# Patient Record
Sex: Female | Born: 2000
Health system: Southern US, Community
[De-identification: ages and names within clinical notes are randomized; demographics above are authoritative.]

## PROBLEM LIST (undated history)

## (undated) HISTORY — PX: TONSILLECTOMY AND ADENOIDECTOMY: SUR1326

## (undated) HISTORY — PX: TYMPANOPLASTY: SHX33

---
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
## 2021-05-04 ENCOUNTER — Other Ambulatory Visit: Payer: Self-pay

## 2021-05-04 ENCOUNTER — Emergency Department (HOSPITAL_COMMUNITY): Payer: BLUE CROSS/BLUE SHIELD

## 2021-05-04 ENCOUNTER — Encounter (HOSPITAL_COMMUNITY): Payer: Self-pay | Admitting: Emergency Medicine

## 2021-05-04 ENCOUNTER — Emergency Department (HOSPITAL_COMMUNITY)
Admission: EM | Admit: 2021-05-04 | Discharge: 2021-05-04 | Disposition: A | Discharge status: Home / Self Care | Payer: BLUE CROSS/BLUE SHIELD | Attending: Emergency Medicine | Admitting: Emergency Medicine

## 2021-05-04 DIAGNOSIS — N200 Calculus of kidney: Secondary | ICD-10-CM

## 2021-05-04 DIAGNOSIS — N132 Hydronephrosis with renal and ureteral calculous obstruction: Secondary | ICD-10-CM | POA: Insufficient documentation

## 2021-05-04 DIAGNOSIS — F17290 Nicotine dependence, other tobacco product, uncomplicated: Secondary | ICD-10-CM | POA: Insufficient documentation

## 2021-05-04 LAB — URINALYSIS, ROUTINE W REFLEX MICROSCOPIC
Bacteria, UA: NONE SEEN
Bilirubin Urine: NEGATIVE
Glucose, UA: NEGATIVE mg/dL
Ketones, ur: 40 mg/dL — AB
Leukocytes,Ua: NEGATIVE
Nitrite: NEGATIVE
Protein, ur: NEGATIVE mg/dL
Specific Gravity, Urine: 1.015 (ref 1.005–1.030)
pH: 8.5 — ABNORMAL HIGH (ref 5.0–8.0)

## 2021-05-04 LAB — POC URINE PREG, ED: Preg Test, Ur: NEGATIVE

## 2021-05-04 MED ORDER — TAMSULOSIN HCL 0.4 MG PO CAPS: 0.4000 mg | ORAL_CAPSULE | Freq: Every day | ORAL | 0 refills | Status: AC

## 2021-05-04 MED ORDER — ONDANSETRON 4 MG PO TBDP
4.0000 mg | ORAL_TABLET | Freq: Once | ORAL | Status: AC | PRN
  Administered 2021-05-04: 4 mg via ORAL
  Filled 2021-05-04: qty 1

## 2021-05-04 MED ORDER — ONDANSETRON HCL 4 MG/2ML IJ SOLN
4.0000 mg | Freq: Once | INTRAMUSCULAR | Status: AC
  Administered 2021-05-04: 4 mg via INTRAVENOUS
  Filled 2021-05-04: qty 2

## 2021-05-04 MED ORDER — KETOROLAC TROMETHAMINE 30 MG/ML IJ SOLN
30.0000 mg | Freq: Once | INTRAMUSCULAR | Status: AC
  Administered 2021-05-04: 30 mg via INTRAVENOUS
  Filled 2021-05-04: qty 1

## 2021-05-04 MED ORDER — MORPHINE SULFATE (PF) 4 MG/ML IV SOLN: 4.0000 mg | Freq: Once | INTRAVENOUS | Status: DC

## 2021-05-04 MED ORDER — SODIUM CHLORIDE 0.9 % IV BOLUS
1000.0000 mL | Freq: Once | INTRAVENOUS | Status: AC
  Administered 2021-05-04: 1000 mL via INTRAVENOUS

## 2021-05-04 MED ORDER — KETOROLAC TROMETHAMINE 30 MG/ML IJ SOLN
30.0000 mg | Freq: Once | INTRAMUSCULAR | Status: AC
  Administered 2021-05-04: 30 mg via INTRAVENOUS

## 2021-05-04 MED ORDER — KETOROLAC TROMETHAMINE 60 MG/2ML IM SOLN
30.0000 mg | Freq: Once | INTRAMUSCULAR | Status: DC
  Filled 2021-05-04: qty 2

## 2021-05-04 MED ORDER — OXYCODONE-ACETAMINOPHEN 5-325 MG PO TABS: 1.0000 | ORAL_TABLET | Freq: Four times a day (QID) | ORAL | 0 refills | Status: AC | PRN

## 2021-05-04 NOTE — ED Provider Notes (Signed)
[facility]-EMERGENCY DEPT Provider Note   CSN: [id number] Arrival date & time: 05/04/21  1324     History Chief Complaint  Patient presents with   Abdominal Pain   Emesis    Tina Pratt is a 20 y.o. female past medical history of renal calculus at age 8 and removal her right ovary presenting today with a complaint of abdominal pain and vomiting.  She reports that 2 days ago she began to feel pain in her right lower quadrant and back.  The symptoms worsened and she presented to [facility] health in [place] yesterday however she was unsatisfied with her work-up.  Patient sexually active with 1 partner and denies concern for STD or pregnancy today.  Denies urinary symptoms.  Endorses nausea but denies diarrhea or constipation.  No fever, chills, dizziness or fatigue.   Abdominal Pain Associated symptoms: vomiting   Emesis Associated symptoms: abdominal pain       History reviewed. No pertinent past medical history.  Patient Active Problem List   Diagnosis Date Noted   Strain of calf muscle 01/16/2015    Past Surgical History:  Procedure Laterality Date   TONSILLECTOMY AND ADENOIDECTOMY     TYMPANOPLASTY       OB History   No obstetric history on file.     No family history on file.  Social History   Tobacco Use   Smoking status: Every Day    Types: E-cigarettes   Smokeless tobacco: Never  Substance Use Topics   Alcohol use: No    Alcohol/week: 0.0 standard drinks   Drug use: No    Home Medications Prior to Admission medications   Medication Sig Start Date End Date Taking? Authorizing Provider  predniSONE (DELTASONE) 20 MG tablet Take 1 tablet (20 mg total) by mouth 2 (two) times daily with a meal. 11/02/20   Eustace Moore, MD    Allergies    Patient has no known allergies.  Review of Systems   Review of Systems  Gastrointestinal:  Positive for abdominal pain and vomiting.   Physical Exam Updated Vital Signs BP 128/82    Pulse 68   Temp 98.4 F (36.9 C) (Oral)   Resp 16   Ht 5\' 1"  (1.549 m)   Wt 72.6 kg   SpO2 99%   BMI 30.23 kg/m   Physical Exam Vitals and nursing note reviewed.  Constitutional:      Appearance: Normal appearance.  HENT:     Head: Normocephalic and atraumatic.  Eyes:     General: No scleral icterus.    Conjunctiva/sclera: Conjunctivae normal.  Cardiovascular:     Rate and Rhythm: Normal rate and regular rhythm.  Pulmonary:     Effort: Pulmonary effort is normal. No respiratory distress.     Breath sounds: Normal breath sounds.  Abdominal:     General: Abdomen is flat.     Tenderness: There is abdominal tenderness. There is right CVA tenderness. There is no left CVA tenderness.  Skin:    General: Skin is warm and dry.     Coloration: Skin is not pale.     Findings: No rash.  Neurological:     Mental Status: She is alert.  Psychiatric:        Mood and Affect: Mood normal.    ED Results / Procedures / Treatments   Labs (all labs ordered are listed, but only abnormal results are displayed) Labs Reviewed  URINALYSIS, ROUTINE W REFLEX MICROSCOPIC - Abnormal; Notable for the  following components:      Result Value   Color, Urine YELLOW (*)    APPearance CLEAR (*)    pH 8.5 (*)    Hgb urine dipstick LARGE (*)    Ketones, ur 40 (*)    All other components within normal limits  POC URINE PREG, ED    EKG None  Radiology CT Renal Stone Study  Result Date: 05/04/2021 CLINICAL DATA:  Right flank pain. EXAM: CT ABDOMEN AND PELVIS WITHOUT CONTRAST TECHNIQUE: Multidetector CT imaging of the abdomen and pelvis was performed following the standard protocol without IV contrast. COMPARISON:  None. FINDINGS: Lower chest: No acute abnormality. Hepatobiliary: No focal liver abnormality is seen. No gallstones, gallbladder wall thickening, or biliary dilatation. Pancreas: Unremarkable. No pancreatic ductal dilatation or surrounding inflammatory changes. Spleen: Normal in size  without focal abnormality. Adrenals/Urinary Tract: There is right hydronephrosis with right perinephric stranding due to obstruction by 3 mm stone in the mid right U. the left kidney is normal without hydronephrosis. The bilateral adrenal glands are normal. The bladder is normal. Stomach/Bowel: Stomach is within normal limits. The appendix is not definitely seen but no inflammation is noted around cecum. No evidence of bowel wall thickening, distention, or inflammatory changes. Vascular/Lymphatic: No significant vascular findings are present. No enlarged abdominal or pelvic lymph nodes. Reproductive: Uterus and bilateral adnexa are unremarkable. Other: None. Musculoskeletal: No acute abnormality. IMPRESSION: Right hydronephrosis with right perinephric stranding due to obstruction by 3 mm stone in the mid right ureter. Electronically Signed   By: Sherian Rein M.D.   On: 05/04/2021 09:52    Procedures Procedures   Medications Ordered in ED Medications  ketorolac (TORADOL) injection 30 mg (has no administration in time range)  ondansetron (ZOFRAN-ODT) disintegrating tablet 4 mg (4 mg Oral Given 05/04/21 0620)  sodium chloride 0.9 % bolus 1,000 mL (1,000 mLs Intravenous Bolus 05/04/21 0858)  ondansetron (ZOFRAN) injection 4 mg (4 mg Intravenous Given 05/04/21 0859)  ketorolac (TORADOL) 30 MG/ML injection 30 mg (30 mg Intravenous Given 05/04/21 0859)    ED Course  I have reviewed the triage vital signs and the nursing notes.  Pertinent labs & imaging results that were available during my care of the patient were reviewed by me and considered in my medical decision making (see chart for details).    MDM Rules/Calculators/A&P  Tina Pratt is a 20 y.o. female past medical history of renal calculus at age 50 and removal her right ovary presenting today with a complaint of abdominal pain and vomiting.  She reports that 2 days ago she began to feel pain in her right lower quadrant and back.  The symptoms  worsened and she presented to [facility] health in [place] yesterday however she was unsatisfied with her work-up.   Patient found to have 3 mm obstructing stone in the mid right ureter.  Associated hydronephrosis with perinephric stranding.  I contacted the on-call urologist about patient treatment and follow-up.  Dr. Alvester Morin stated that a stone of this size should pass on its own.  Because the patient is without systemic signs of infection, nor displays signs of a urinary infection he suggested that I send the patient home with Flomax and Percocet to assist with the pain and hopefully the passage of the stone.  She will follow-up with his office in 1 week if her pain persists.   Patient educated on Percocet use as well as return precautions.  Patient and fianc agreeable to this plan and stable for discharge.  They are both thankful for her care today.  Final Clinical Impression(s) / ED Diagnoses Final diagnoses:  Renal calculus, right    Rx / DC Orders Results and diagnoses were explained to the patient. Return precautions discussed in full. Patient had no additional questions and expressed complete understanding.     Saddie Benders, PA-C 05/04/21 1039    Ernie Avena, MD 05/04/21 1438

## 2021-05-04 NOTE — Discharge Instructions (Addendum)
You are found to have a kidney stone in your right ureter.  I am sending you some medication that should help you pass the stone.  You also have 3 days worth of Percocet to help with pain and hopefully help with passing the stone.  Please do not drive on this medication.  Know that it may cause some constipation.  You do not have to take the Percocet, only take it for severe pain.  This is a controlled substance so please keep it out of reach of other people.  I am glad you came in today and I hope that you feel better!

## 2021-05-04 NOTE — ED Triage Notes (Signed)
Patient arrives with continued abdominal pain and emesis. Patient evaluated and treated at [facility], but states she has had continued vomiting since leaving.

## 2021-05-08 ENCOUNTER — Ambulatory Visit: Payer: Self-pay

## 2021-05-08 NOTE — Telephone Encounter (Addendum)
Pt. Called back. Vomited yesterday. States the color was black-brown. Still has some nausea.Only happened 1 time.Will continue to monitor. Instructed to return to ED if vomiting continues.Instructed to continue to stay hydrated.   Pt. Seen in ED 05/04/21 with right kidney stone. States pain had gotten better, but last night came back and is severe. Pt. Tearful. Has not called urology because she currently does not have insurance. Will have fiance take her to ED.    Reason for Disposition  [1] SEVERE pain (e.g., excruciating, scale 8-10) AND [2] not improved after pain medicine  Answer Assessment - Initial Assessment Questions 1. LOCATION: "Where does it hurt?" (e.g., left, right)     Right flank and abd. 2. ONSET: "When did the pain start?"     Last week 3. SEVERITY: "How bad is the pain?" (e.g., Scale 1-10; mild, moderate, or severe)   - MILD (1-3): doesn't interfere with normal activities    - MODERATE (4-7): interferes with normal activities or awakens from sleep    - SEVERE (8-10): excruciating pain and patient unable to do normal activities (stays in bed)       Severe 4. PATTERN: "Does the pain come and go, or is it constant?"      Constant 5. CAUSE: "What do you think is causing the pain?"     Kidney stone 6. OTHER SYMPTOMS:  "Do you have any other symptoms?" (e.g., fever, abdominal pain, vomiting, leg weakness, burning with urination, blood in urine)     No 7. PREGNANCY:  "Is there any chance you are pregnant?" "When was your last menstrual period?"     No  Protocols used: Flank Pain-A-AH

## 2022-09-30 IMAGING — CT CT RENAL STONE PROTOCOL
2 of 4 series · 16 of 46 positions shown, 18 images · non-contrast
Comparison: None.

CLINICAL DATA: Right flank pain.

EXAM:
CT ABDOMEN AND PELVIS WITHOUT CONTRAST
TECHNIQUE: Multidetector CT imaging of the abdomen and pelvis was performed
following the standard protocol without IV contrast.

[Series 2: axial st · axial · 0.75mm/px · z∈[+1113,+1533]mm · 13 of 96 slices shown, 15 images]
[im 6/96  soft-tissue]
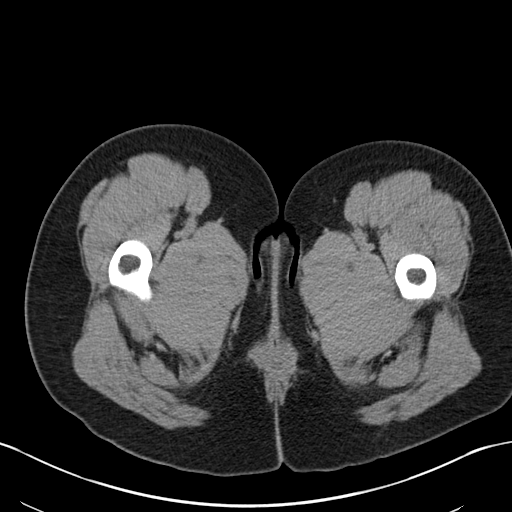
[im 6/96  bone]
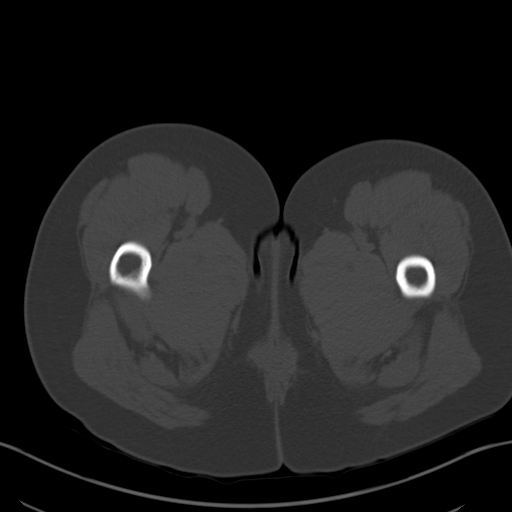
[im 12/96  soft-tissue]
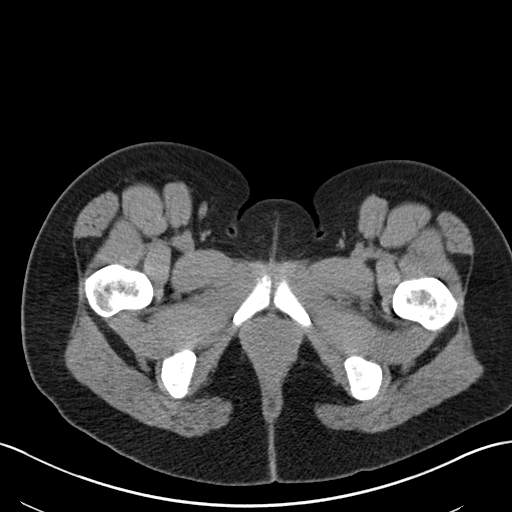
[im 18/96  soft-tissue]
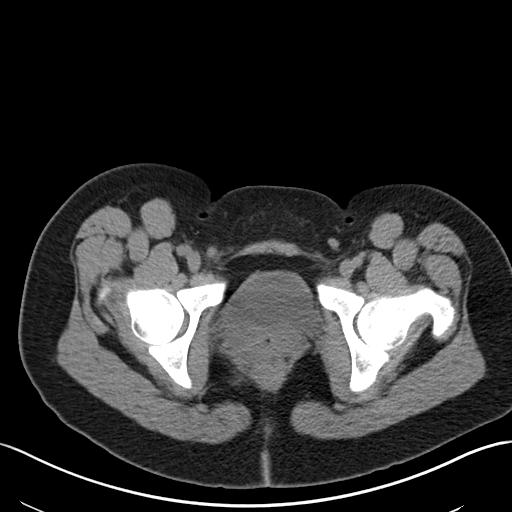
[im 30/96  soft-tissue]
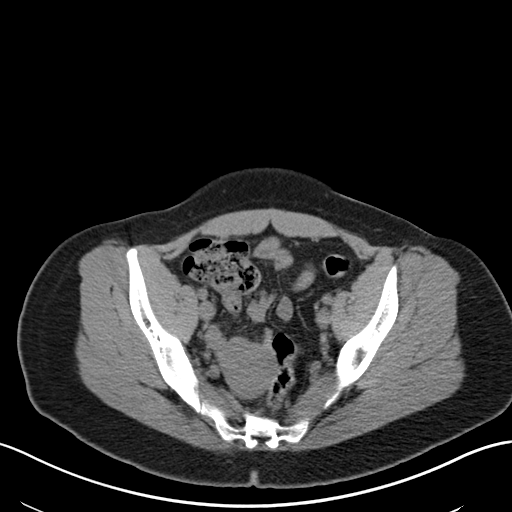
[im 36/96  soft-tissue]
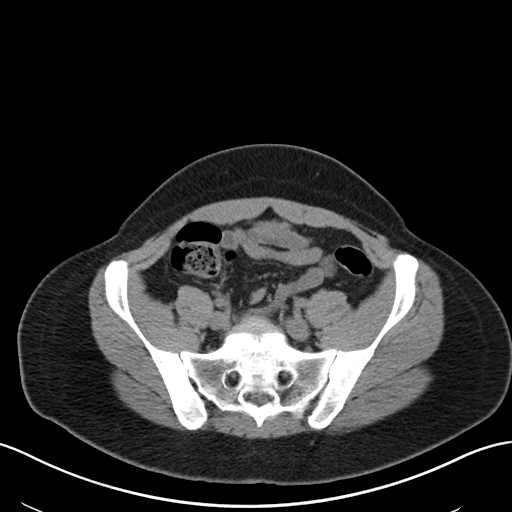
[im 42/96  soft-tissue]
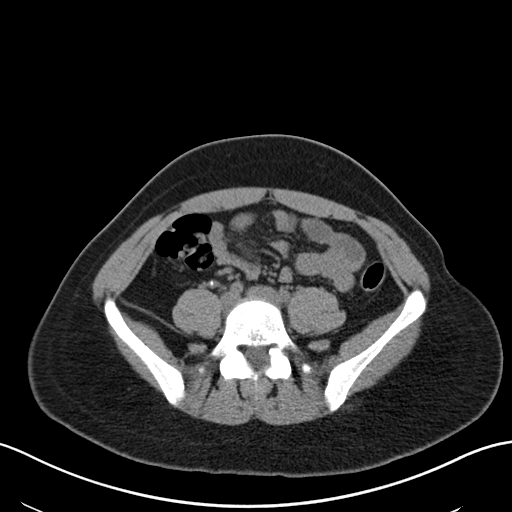
[im 48/96  soft-tissue]
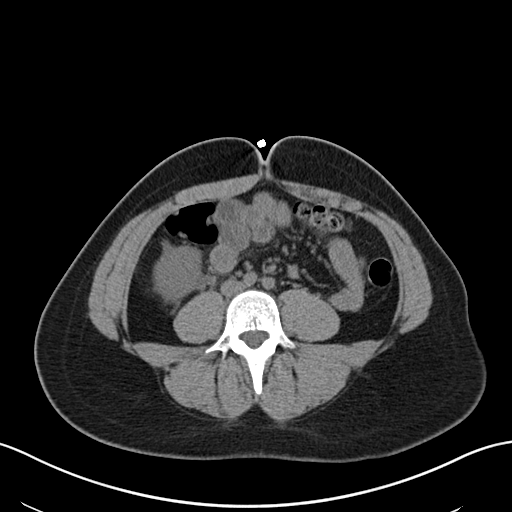
[im 54/96  soft-tissue]
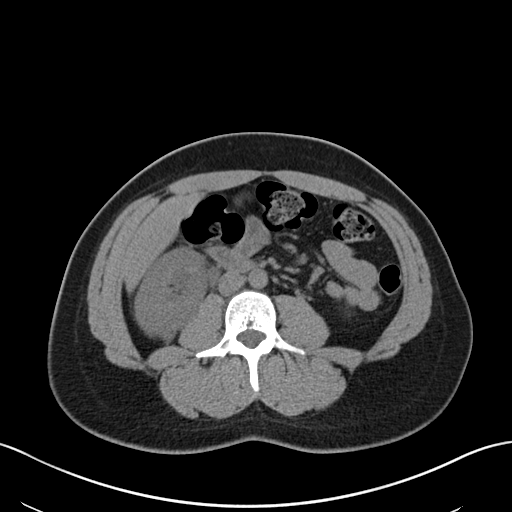
[im 60/96  soft-tissue]
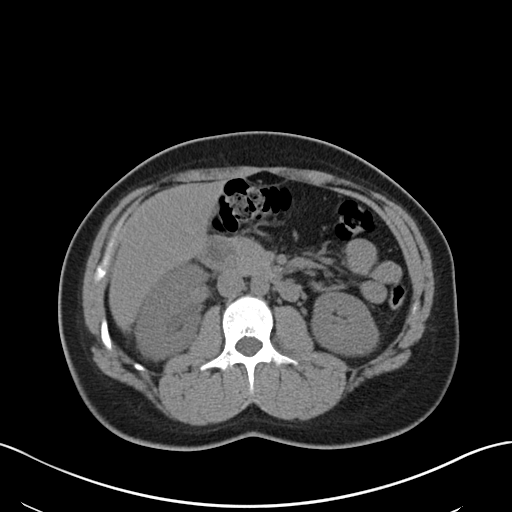
[im 60/96  bone]
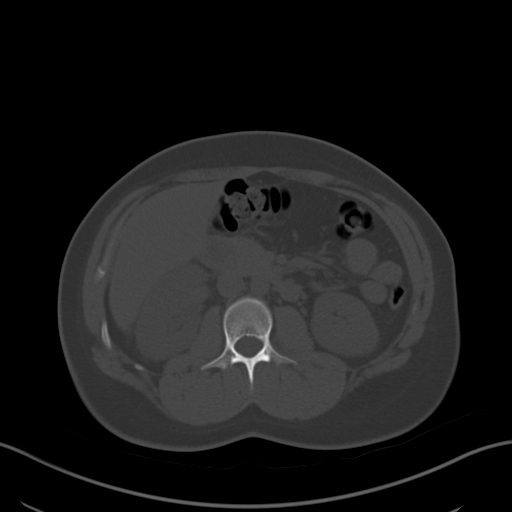
[im 66/96  soft-tissue]
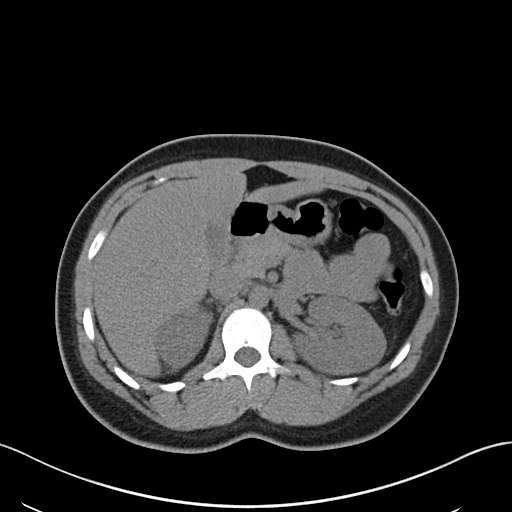
[im 78/96  soft-tissue]
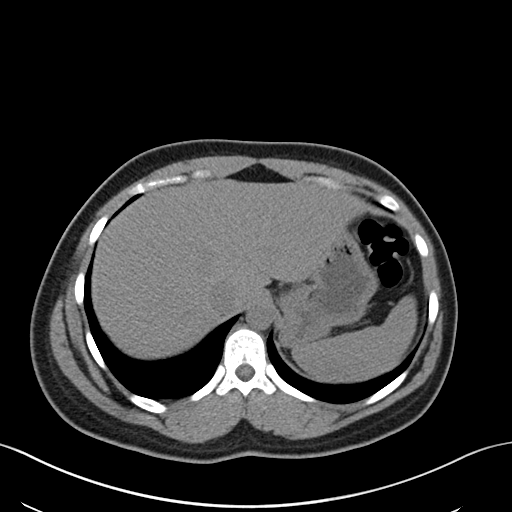
[im 84/96  soft-tissue]
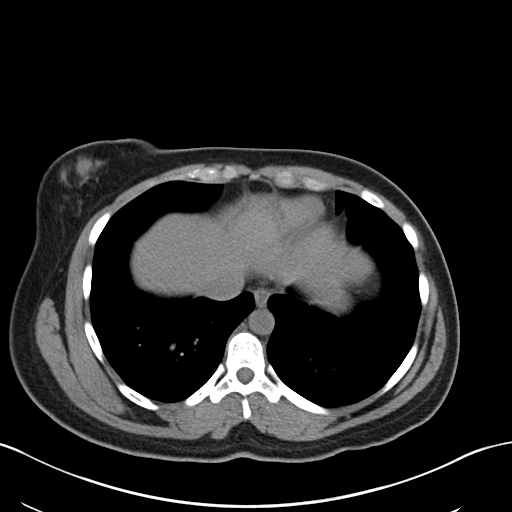
[im 90/96  soft-tissue]
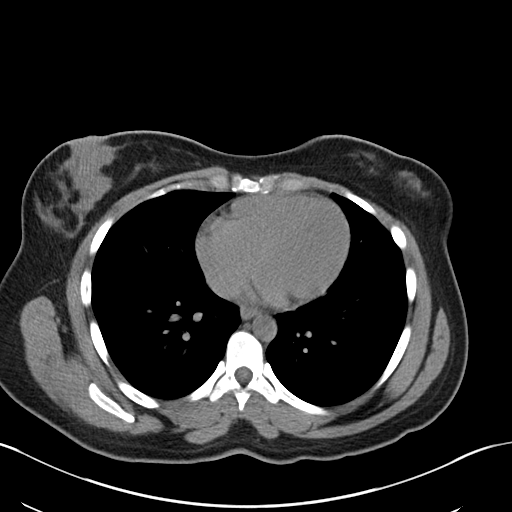

[Series 5: coronal · coronal · 0.80mm/px · 3 of 138 slices shown]
[im 46/138  soft-tissue]
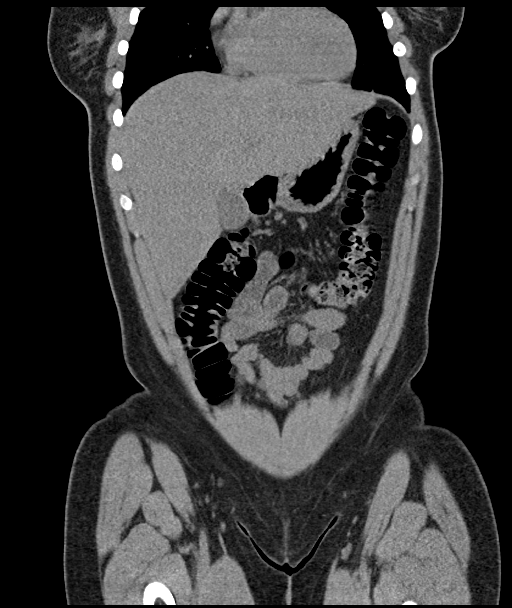
[im 61/138  soft-tissue]
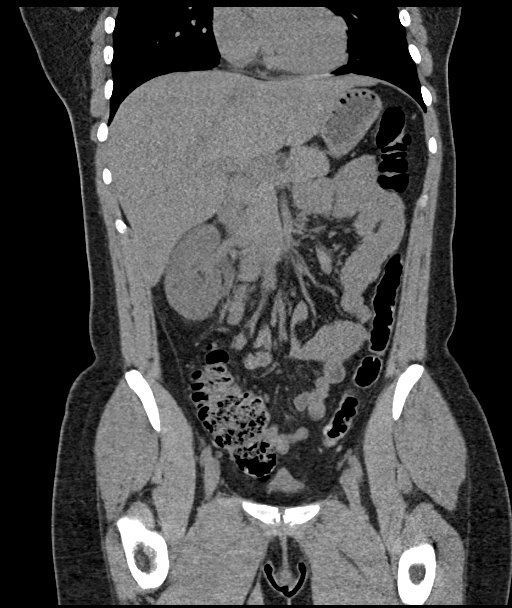
[im 77/138  soft-tissue]
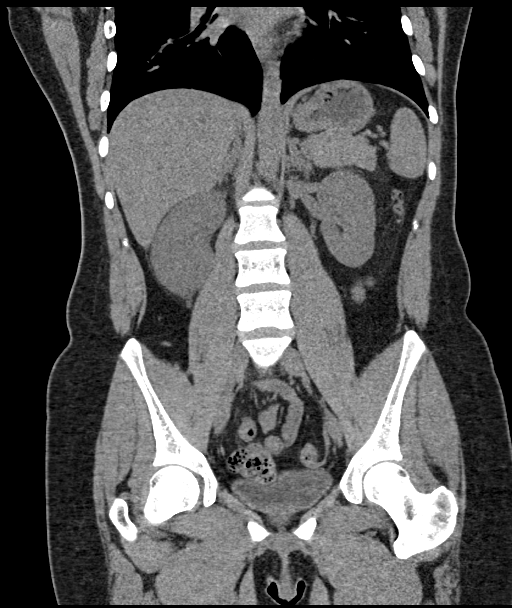

[16 of 46 positions shown; findings below may reference images not displayed]

FINDINGS: Lower chest: No acute abnormality.

Hepatobiliary: No focal liver abnormality is seen. No gallstones,
gallbladder wall thickening, or biliary dilatation.

Pancreas: Unremarkable. No pancreatic ductal dilatation or
surrounding inflammatory changes.

Spleen: Normal in size without focal abnormality.

Adrenals/Urinary Tract: There is right hydronephrosis with right
perinephric stranding due to obstruction by 3 mm stone in the mid
right U. the left kidney is normal without hydronephrosis. The
bilateral adrenal glands are normal. The bladder is normal.

Stomach/Bowel: Stomach is within normal limits. The appendix is not
definitely seen but no inflammation is noted around cecum. No
evidence of bowel wall thickening, distention, or inflammatory
changes.

Vascular/Lymphatic: No significant vascular findings are present. No
enlarged abdominal or pelvic lymph nodes.

Reproductive: Uterus and bilateral adnexa are unremarkable.

Other: None.

Musculoskeletal: No acute abnormality.
IMPRESSION: Right hydronephrosis with right perinephric stranding due to
obstruction by 3 mm stone in the mid right ureter.

## 2023-07-02 ENCOUNTER — Other Ambulatory Visit (HOSPITAL_COMMUNITY): Payer: Self-pay
